# Patient Record
Sex: Male | Born: 1955 | Race: White | Hispanic: No | Marital: Married | Smoking: Current every day smoker
Health system: Southern US, Community
[De-identification: ages and names within clinical notes are randomized; demographics above are authoritative.]

## PROBLEM LIST (undated history)

## (undated) DIAGNOSIS — F17200 Nicotine dependence, unspecified, uncomplicated: Secondary | ICD-10-CM

## (undated) HISTORY — DX: Nicotine dependence, unspecified, uncomplicated: F17.200

---

## 2000-05-20 ENCOUNTER — Emergency Department (HOSPITAL_COMMUNITY): Admission: EM | Admit: 2000-05-20 | Discharge: 2000-05-20 | Payer: Self-pay | Admitting: Emergency Medicine

## 2004-08-17 ENCOUNTER — Ambulatory Visit (HOSPITAL_COMMUNITY): Admission: RE | Admit: 2004-08-17 | Discharge: 2004-08-17 | Payer: Self-pay | Admitting: Internal Medicine

## 2005-08-06 IMAGING — CR DG ORBITS FOR FOREIGN BODY
2 series · 2 of 2 positions shown · non-contrast
Comparison: none

CLINICAL DATA: Welder.
 EYE FOR FOREIGN BODY PRIOR TO MRI ? 08/17/2004:
 No radiopaque foreign bodies are seen.  No findings of concern are identified.

[view not recorded (1 of 2)]
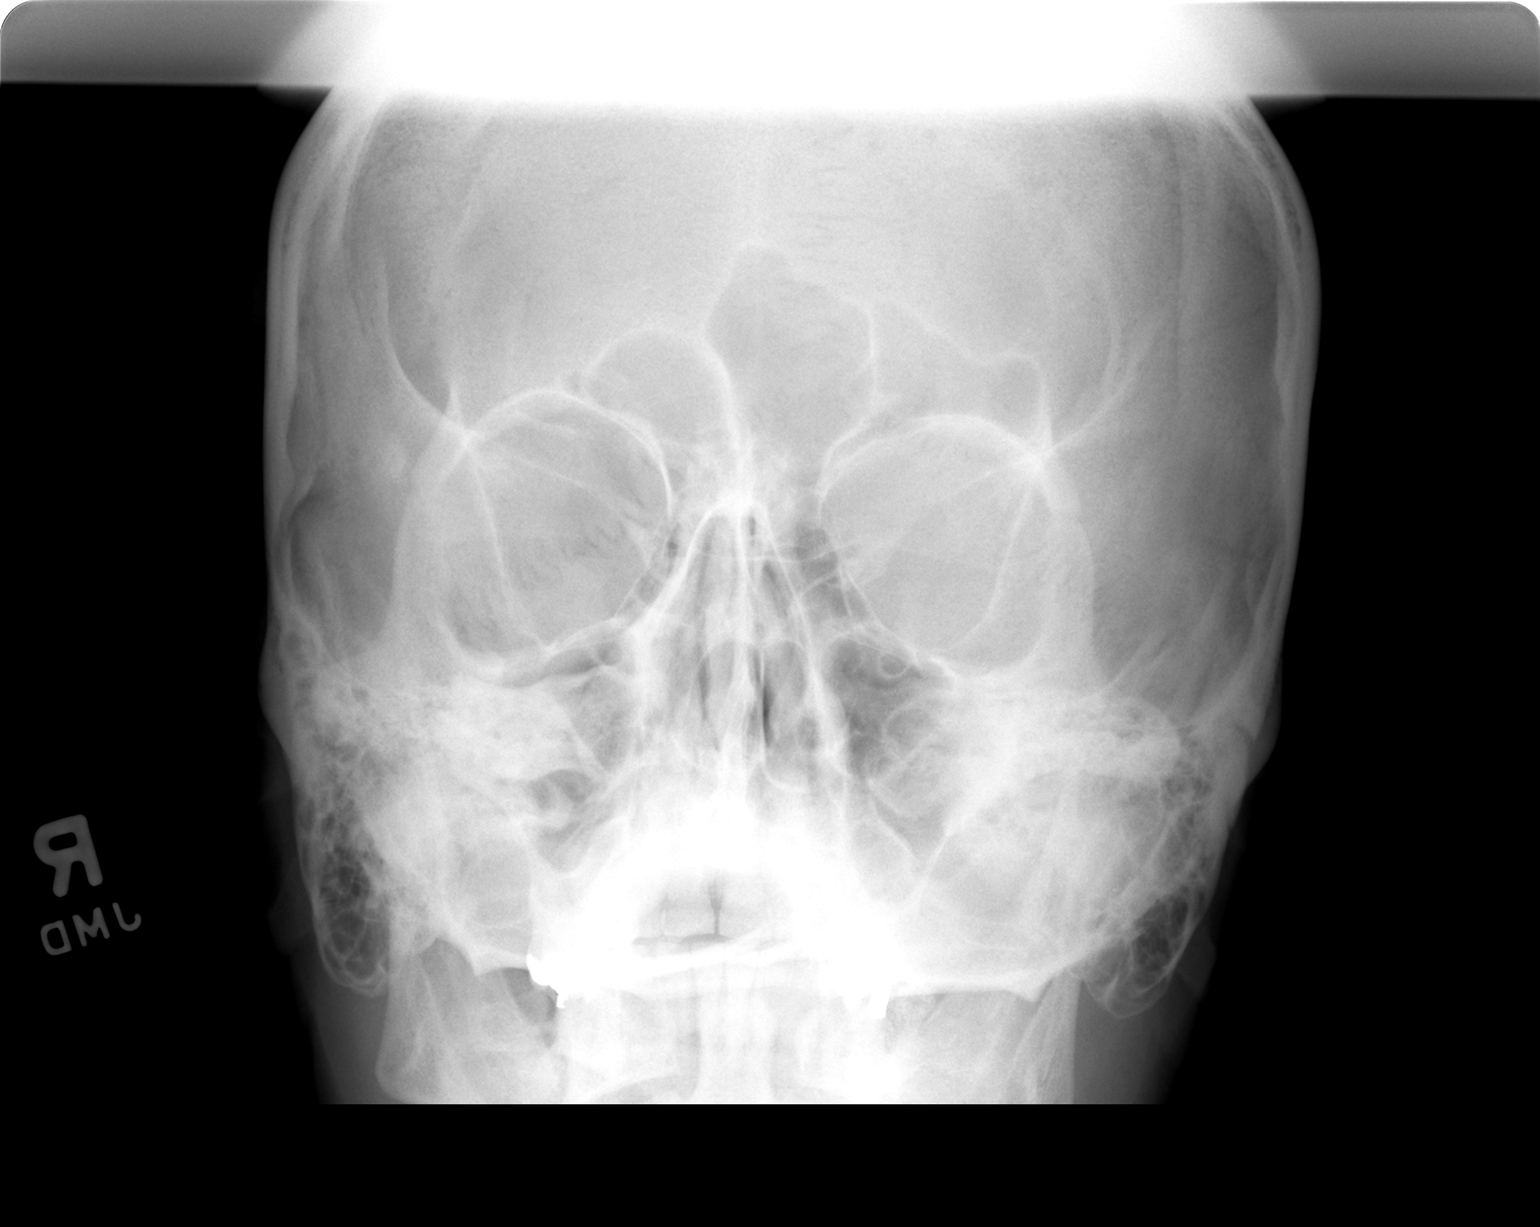

[view not recorded (2 of 2)]
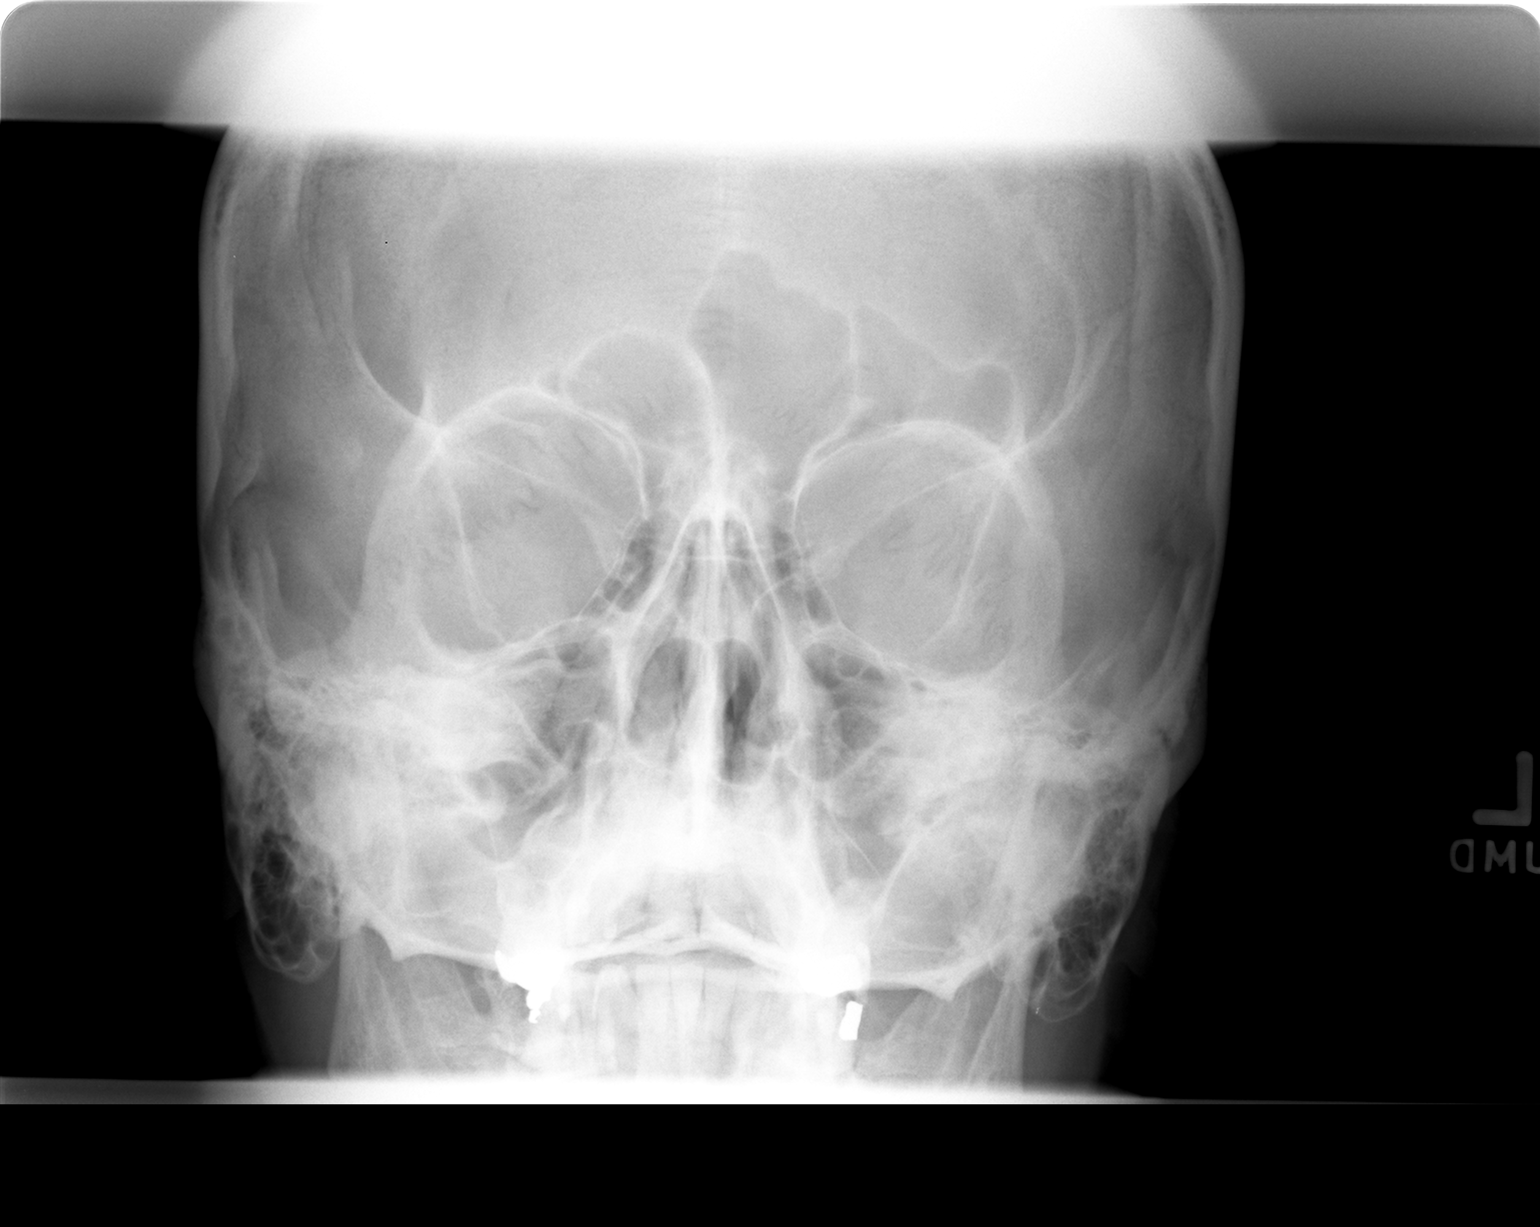

[2 of 2 positions shown; findings below may reference images not displayed]

IMPRESSION: No evidence for radiopaque foreign body.

## 2009-07-11 ENCOUNTER — Ambulatory Visit: Payer: Self-pay | Admitting: Family Medicine

## 2010-07-16 ENCOUNTER — Ambulatory Visit: Payer: Self-pay | Admitting: Family Medicine

## 2011-06-19 ENCOUNTER — Ambulatory Visit (INDEPENDENT_AMBULATORY_CARE_PROVIDER_SITE_OTHER): Payer: BC Managed Care – PPO | Admitting: Medical

## 2011-06-19 ENCOUNTER — Encounter: Payer: Self-pay | Admitting: Medical

## 2011-06-19 VITALS — BP 130/92 | HR 64 | Temp 98.0°F | Resp 16 | Ht 66.0 in | Wt 192.0 lb

## 2011-06-19 DIAGNOSIS — M25579 Pain in unspecified ankle and joints of unspecified foot: Secondary | ICD-10-CM

## 2011-06-19 DIAGNOSIS — M25572 Pain in left ankle and joints of left foot: Secondary | ICD-10-CM

## 2011-06-19 MED ORDER — DICLOFENAC SODIUM 75 MG PO TBEC: 75.0000 mg | DELAYED_RELEASE_TABLET | Freq: Two times a day (BID) | ORAL | Status: AC

## 2011-06-19 NOTE — Progress Notes (Signed)
  Subjective:   HPI Gerald Wood is a 55 y.o. male who presents for c/o left ankle pain x 1-2 weeks.   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  He denies any recent injury, trauma, or twisted ankle.  He wears work boots, and sometimes is on his feet 12 + hours a day.  Changes his boots about once yearly.  He was curious why his ankle is hurting, not sure if its an ankle vs foot or back problem.  Using nothing for symptoms.  Pain is localized to inside of his left ankle, no particular motion worsens the pain.  No other aggravating or relieving factors.  No prior similar episode.  No other c/o.  The following portions of the patient's history were reviewed and updated as appropriate: allergies, current medications, past family history, past medical history, past social history, past surgical history and problem list.  Past Medical History  Diagnosis Date  . Tobacco use disorder    Review of Systems Gen.: No fever, chills, sweats, weight gain Skin: No rash or bruising MSK: No joint swelling, no other joint pain or myalgias Neuro: No numbness or tingling Back: No pain Abdomen: No pain, nausea, vomiting, stool changes     Objective:   Physical Exam  General appearance: alert, no distress, WD/WN, white male Skin: No obvious erythema or ecchymosis MSK: Tender over left medial malleolus, tender just superior to this along the distal tibia, but otherwise non tender, no swelling, normal ankle and foot range of motion, no obvious abnormality. Rest of lower extremity exam is unremarkable Neuro: Normal strength and sensation of lower extremities, DTRs normal   Assessment :    Encounter Diagnosis  Name Primary?  . Left ankle pain Yes    Plan:  Left ankle xray today with no obvious bony abnormality, no fracture, no soft tissue swelling, unremarkable.  Will send xray for over read.   I will  treat for inflammation with Voltaren twice a day as needed for the next 5-7 days, advised ice, elevation, relative rest, and consider changing boots as his wear pattern suggest over supination and these boots appear to be at the end of their useful life.  I suspect the cause is inflammation from boot wear and activity, but if not improving in one to 2 weeks, consider stress fracture or other etiology.

## 2011-07-01 ENCOUNTER — Encounter: Payer: Self-pay | Admitting: Family Medicine

## 2011-07-02 ENCOUNTER — Encounter: Payer: Self-pay | Admitting: Family Medicine

## 2011-07-02 ENCOUNTER — Ambulatory Visit (INDEPENDENT_AMBULATORY_CARE_PROVIDER_SITE_OTHER): Payer: BC Managed Care – PPO | Admitting: Family Medicine

## 2011-07-02 VITALS — BP 130/80 | HR 82 | Ht 69.0 in | Wt 192.0 lb

## 2011-07-02 DIAGNOSIS — Z0000 Encounter for general adult medical examination without abnormal findings: Secondary | ICD-10-CM

## 2011-07-02 DIAGNOSIS — Z23 Encounter for immunization: Secondary | ICD-10-CM

## 2011-07-02 DIAGNOSIS — F17200 Nicotine dependence, unspecified, uncomplicated: Secondary | ICD-10-CM

## 2011-07-02 LAB — CBC WITH DIFFERENTIAL/PLATELET
Basophils Absolute: 0 10*3/uL (ref 0.0–0.1)
Basophils Relative: 0 % (ref 0–1)
Eosinophils Absolute: 0.1 10*3/uL (ref 0.0–0.7)
Eosinophils Relative: 1 % (ref 0–5)
HCT: 49.6 % (ref 39.0–52.0)
Hemoglobin: 16.6 g/dL (ref 13.0–17.0)
Lymphocytes Relative: 17 % (ref 12–46)
Lymphs Abs: 1.5 10*3/uL (ref 0.7–4.0)
MCH: 30 pg (ref 26.0–34.0)
MCV: 89.5 fL (ref 78.0–100.0)
Monocytes Absolute: 0.5 10*3/uL (ref 0.1–1.0)
Monocytes Relative: 6 % (ref 3–12)
Neutro Abs: 7 10*3/uL (ref 1.7–7.7)
Neutrophils Relative %: 77 % (ref 43–77)
Platelets: 259 10*3/uL (ref 150–400)
RBC: 5.54 MIL/uL (ref 4.22–5.81)
WBC: 9.2 10*3/uL (ref 4.0–10.5)

## 2011-07-02 LAB — POCT URINALYSIS DIPSTICK
Bilirubin, UA: NEGATIVE
Glucose, UA: NEGATIVE
Ketones, UA: NEGATIVE
Leukocytes, UA: NEGATIVE
Nitrite, UA: NEGATIVE
Spec Grav, UA: 1.02
Urobilinogen, UA: NEGATIVE
pH, UA: 6

## 2011-07-02 LAB — COMPREHENSIVE METABOLIC PANEL
ALT: 21 U/L (ref 0–53)
AST: 21 U/L (ref 0–37)
Alkaline Phosphatase: 63 U/L (ref 39–117)
BUN: 18 mg/dL (ref 6–23)
CO2: 29 mEq/L (ref 19–32)
Calcium: 9.1 mg/dL (ref 8.4–10.5)
Chloride: 100 mEq/L (ref 96–112)
Creat: 1.06 mg/dL (ref 0.50–1.35)
Glucose, Bld: 84 mg/dL (ref 70–99)
Potassium: 4 mEq/L (ref 3.5–5.3)
Sodium: 139 mEq/L (ref 135–145)
Total Bilirubin: 0.9 mg/dL (ref 0.3–1.2)
Total Protein: 6.8 g/dL (ref 6.0–8.3)

## 2011-07-02 LAB — LIPID PANEL
Cholesterol: 198 mg/dL (ref 0–200)
HDL: 38 mg/dL — ABNORMAL LOW (ref >39)
LDL Cholesterol: 128 mg/dL — ABNORMAL HIGH (ref 0–99)
Total CHOL/HDL Ratio: 5.2 Ratio
Triglycerides: 159 mg/dL — ABNORMAL HIGH (ref <150)
VLDL: 32 mg/dL (ref 0–40)

## 2011-07-02 NOTE — Progress Notes (Signed)
  Subjective:    Patient ID: Gerald Wood, male    DOB: 01-Dec-1955, 55 y.o.   MRN: [id number]  HPI He is here for complete examination. He does have occasional aches and pains but rarely takes anything for them except an occasional Voltaren. He has no other concerns or complaints. His work is going well. Home life is going fairly well although he does have 2 daughters still living at home.   Review of Systems  Constitutional: Negative.   HENT: Negative.   Eyes: Negative.   Respiratory: Negative.   Cardiovascular: Negative.   Gastrointestinal: Negative.   Genitourinary: Negative.   Musculoskeletal: Negative.   Skin: Negative.   Neurological: Negative.   Hematological: Negative.   Psychiatric/Behavioral: Negative.        Objective:   Physical Exam BP 130/80  Pulse 82  Ht 5\' 9"  (1.753 m)  Wt 192 lb (87.091 kg)  BMI 28.35 kg/m2  General Appearance:    Alert, cooperative, no distress, appears stated age  Head:    Normocephalic, without obvious abnormality, atraumatic  Eyes:    PERRL, conjunctiva/corneas clear, EOM's intact, fundi    benign  Ears:    Normal TM's and external ear canals  Nose:   Nares normal, mucosa normal, no drainage or sinus   tenderness  Throat:   Lips, mucosa, and tongue normal; teeth and gums normal  Neck:   Supple, no lymphadenopathy;  thyroid:  no   enlargement/tenderness/nodules; no carotid   bruit or JVD  Back:    Spine nontender, no curvature, ROM normal, no CVA     tenderness  Lungs:     Clear to auscultation bilaterally without wheezes, rales or     ronchi; respirations unlabored  Chest Wall:    No tenderness or deformity   Heart:    Regular rate and rhythm, S1 and S2 normal, no murmur, rub   or gallop  Breast Exam:    No chest wall tenderness, masses or gynecomastia  Abdomen:     Soft, non-tender, nondistended, normoactive bowel sounds,    no masses, no hepatosplenomegaly     Rectal:    Normal sphincter tone, no masses or tenderness; guaiac  negative stool.  Prostate smooth, no nodules, not enlarged.  Extremities:   No clubbing, cyanosis or edema  Pulses:   2+ and symmetric all extremities  Skin:   Skin color, texture, turgor normal, no rashes or lesions  Lymph nodes:   Cervical, supraclavicular, and axillary nodes normal  Neurologic:   CNII-XII intact, normal strength, sensation and gait; reflexes 2+ and symmetric throughout          Psych:   Normal mood, affect, hygiene and grooming.          Assessment & Plan:   1. Physical exam, annual  POCT Urinalysis Dipstick, CBC with Differential, Comprehensive metabolic panel, Lipid panel, POCT Hemoccult (POC) Blood/Stool Test  2. Active smoker     colonoscopy offered but patient refused. Also discussed the fact that his daughters are living at home. Discussed the fact that he could potentially be enabling them to not progress on with their lives. Flu shot given.

## 2011-07-02 NOTE — Patient Instructions (Signed)
Let me know when you're Gerald Wood to quit and I will work with you on that.

## 2013-09-30 ENCOUNTER — Encounter: Payer: Self-pay | Admitting: Family Medicine

## 2013-09-30 ENCOUNTER — Ambulatory Visit (INDEPENDENT_AMBULATORY_CARE_PROVIDER_SITE_OTHER): Payer: Managed Care, Other (non HMO) | Admitting: Family Medicine

## 2013-09-30 VITALS — BP 118/90 | HR 94 | Wt 204.0 lb

## 2013-09-30 DIAGNOSIS — H109 Unspecified conjunctivitis: Secondary | ICD-10-CM

## 2013-09-30 DIAGNOSIS — J209 Acute bronchitis, unspecified: Secondary | ICD-10-CM

## 2013-09-30 MED ORDER — AMOXICILLIN 875 MG PO TABS: 875.0000 mg | ORAL_TABLET | Freq: Two times a day (BID) | ORAL | Status: DC

## 2013-09-30 NOTE — Progress Notes (Signed)
   Subjective:    Patient ID: Gerald Wood, male    DOB: 10/18/1955, 58 y.o.   MRN: [id number]  HPI He complains of a 4 day history of sore throat, fever and now productive cough that started yesterday. He did note some drainage from his eye. No fever, chills, earache, and his pain. He does smoke and at this time is not interested in quitting   Review of Systems     Objective:   Physical Exam alert and in no distress. Tympanic membranes and canals are normal. Throat is clear. Tonsils are normal. Neck is supple without adenopathy or thyromegaly. Cardiac exam shows a regular sinus rhythm without murmurs or gallops. Lungs are clear to auscultation. Both conjunctiva are slightly injected with minimal drainage from the right eye.        Assessment & Plan:  Conjunctivitis  Acute bronchitis  I will treat him with Amoxil. Also encouraged him to set up an appointment for complete exam. Review of record indicates he has not had a colonoscopy. He was apparently set up for this and did not get it.

## 2013-10-20 ENCOUNTER — Encounter: Payer: Managed Care, Other (non HMO) | Admitting: Family Medicine

## 2013-11-09 ENCOUNTER — Encounter: Payer: Managed Care, Other (non HMO) | Admitting: Family Medicine

## 2013-11-09 ENCOUNTER — Telehealth: Payer: Self-pay | Admitting: Family Medicine

## 2013-11-09 DIAGNOSIS — Z0000 Encounter for general adult medical examination without abnormal findings: Secondary | ICD-10-CM

## 2013-11-09 NOTE — Telephone Encounter (Signed)
Pt had cpe today and moved it to next week.  He wants to have fasting labs prior.  Please put in orders for his labs.

## 2013-11-17 ENCOUNTER — Other Ambulatory Visit: Payer: Managed Care, Other (non HMO)

## 2013-11-17 DIAGNOSIS — Z0000 Encounter for general adult medical examination without abnormal findings: Secondary | ICD-10-CM

## 2013-11-17 LAB — CBC WITH DIFFERENTIAL/PLATELET
Basophils Absolute: 0 10*3/uL (ref 0.0–0.1)
Basophils Relative: 0 % (ref 0–1)
Eosinophils Absolute: 0.1 10*3/uL (ref 0.0–0.7)
Eosinophils Relative: 1 % (ref 0–5)
HCT: 46.5 % (ref 39.0–52.0)
Hemoglobin: 16.4 g/dL (ref 13.0–17.0)
Lymphocytes Relative: 21 % (ref 12–46)
Lymphs Abs: 1.6 10*3/uL (ref 0.7–4.0)
MCH: 30 pg (ref 26.0–34.0)
MCHC: 35.3 g/dL (ref 30.0–36.0)
MCV: 85.2 fL (ref 78.0–100.0)
Monocytes Absolute: 0.5 10*3/uL (ref 0.1–1.0)
Monocytes Relative: 6 % (ref 3–12)
Neutro Abs: 5.5 10*3/uL (ref 1.7–7.7)
Neutrophils Relative %: 72 % (ref 43–77)
Platelets: 279 10*3/uL (ref 150–400)
RBC: 5.46 MIL/uL (ref 4.22–5.81)
RDW: 14.3 % (ref 11.5–15.5)
WBC: 7.7 10*3/uL (ref 4.0–10.5)

## 2013-11-17 LAB — LIPID PANEL
Cholesterol: 175 mg/dL (ref 0–200)
HDL: 40 mg/dL (ref >39)
LDL Cholesterol: 119 mg/dL — ABNORMAL HIGH (ref 0–99)
Total CHOL/HDL Ratio: 4.4 Ratio
Triglycerides: 81 mg/dL (ref <150)
VLDL: 16 mg/dL (ref 0–40)

## 2013-11-17 LAB — COMPREHENSIVE METABOLIC PANEL
ALT: 19 U/L (ref 0–53)
AST: 18 U/L (ref 0–37)
Albumin: 4.6 g/dL (ref 3.5–5.2)
Alkaline Phosphatase: 57 U/L (ref 39–117)
BUN: 20 mg/dL (ref 6–23)
CO2: 27 mEq/L (ref 19–32)
Calcium: 9.5 mg/dL (ref 8.4–10.5)
Chloride: 104 mEq/L (ref 96–112)
Creat: 0.98 mg/dL (ref 0.50–1.35)
Glucose, Bld: 90 mg/dL (ref 70–99)
Potassium: 4.1 mEq/L (ref 3.5–5.3)
Sodium: 140 mEq/L (ref 135–145)
Total Bilirubin: 0.6 mg/dL (ref 0.2–1.2)
Total Protein: 6.7 g/dL (ref 6.0–8.3)

## 2013-11-18 ENCOUNTER — Ambulatory Visit (INDEPENDENT_AMBULATORY_CARE_PROVIDER_SITE_OTHER): Payer: Managed Care, Other (non HMO) | Admitting: Family Medicine

## 2013-11-18 ENCOUNTER — Encounter: Payer: Self-pay | Admitting: Family Medicine

## 2013-11-18 VITALS — BP 128/80 | HR 80 | Ht 69.0 in | Wt 198.0 lb

## 2013-11-18 DIAGNOSIS — Z2911 Encounter for prophylactic immunotherapy for respiratory syncytial virus (RSV): Secondary | ICD-10-CM

## 2013-11-18 DIAGNOSIS — Z0000 Encounter for general adult medical examination without abnormal findings: Secondary | ICD-10-CM

## 2013-11-18 DIAGNOSIS — Z1211 Encounter for screening for malignant neoplasm of colon: Secondary | ICD-10-CM

## 2013-11-18 LAB — POCT URINALYSIS DIPSTICK
Bilirubin, UA: NEGATIVE
Blood, UA: 50
Glucose, UA: NEGATIVE
Leukocytes, UA: NEGATIVE
Nitrite, UA: NEGATIVE
Protein, UA: NEGATIVE
Spec Grav, UA: 1.015
Urobilinogen, UA: NEGATIVE
pH, UA: 5

## 2013-11-18 NOTE — Progress Notes (Signed)
   Subjective:    Patient ID: Gerald Wood, male    DOB: 01/09/1956, 58 y.o.   MRN: [id number]  HPI He is here for a complete examination. He has no particular concerns or complaints. He does smoke and is not interested in quitting. Family and social history were reviewed. His work is going well. His home life is going well. His immunizations were updated. He would like to get a shingles vaccine.   Review of Systems  All other systems reviewed and are negative.       Objective:   Physical Exam BP 128/80  Pulse 80  Ht 5\' 9"  (1.753 m)  Wt 198 lb (89.812 kg)  BMI 29.23 kg/m2  General Appearance:    Alert, cooperative, no distress, appears stated age  Head:    Normocephalic, without obvious abnormality, atraumatic  Eyes:    PERRL, conjunctiva/corneas clear, EOM's intact, fundi    benign  Ears:    Normal TM's and external ear canals  Nose:   Nares normal, mucosa normal, no drainage or sinus   tenderness  Throat:   Lips, mucosa, and tongue normal; teeth and gums normal  Neck:   Supple, no lymphadenopathy;  thyroid:  no   enlargement/tenderness/nodules; no carotid   bruit or JVD  Back:    Spine nontender, no curvature, ROM normal, no CVA     tenderness  Lungs:     Clear to auscultation bilaterally without wheezes, rales or     ronchi; respirations unlabored  Chest Wall:    No tenderness or deformity   Heart:    Regular rate and rhythm, S1 and S2 normal, no murmur, rub   or gallop  Breast Exam:    No chest wall tenderness, masses or gynecomastia  Abdomen:     Soft, non-tender, nondistended, normoactive bowel sounds,    no masses, no hepatosplenomegaly  Genitalia:   deferred   Rectal:   deferred   Extremities:   No clubbing, cyanosis or edema  Pulses:   2+ and symmetric all extremities  Skin:   Skin color, texture, turgor normal, no rashes or lesions  Lymph nodes:   Cervical, supraclavicular, and axillary nodes normal  Neurologic:   CNII-XII intact, normal strength, sensation  and gait; reflexes 2+ and symmetric throughout          Psych:   Normal mood, affect, hygiene and grooming.     His blood work was normal.     Assessment & Plan:  Routine general medical examination at a health care facility - Plan: Urinalysis Dipstick, Varicella-zoster vaccine subcutaneous  Special screening for malignant neoplasms, colon - Plan: Ambulatory referral to Gastroenterology  he is not interested in quitting smoking at this time. Did explain that if his insurance didn't cover it he would be an expense to him concerning the suspects and he acknowledges and accepts that.

## 2013-12-27 ENCOUNTER — Encounter: Payer: Self-pay | Admitting: Family Medicine

## 2019-06-30 ENCOUNTER — Other Ambulatory Visit: Payer: Self-pay

## 2019-06-30 DIAGNOSIS — Z20822 Contact with and (suspected) exposure to covid-19: Secondary | ICD-10-CM

## 2019-07-01 LAB — NOVEL CORONAVIRUS, NAA: SARS-CoV-2, NAA: NOT DETECTED

## 2020-02-22 ENCOUNTER — Other Ambulatory Visit: Payer: Self-pay

## 2020-02-22 ENCOUNTER — Ambulatory Visit: Payer: Self-pay

## 2020-02-22 ENCOUNTER — Other Ambulatory Visit: Payer: Self-pay | Admitting: Family Medicine

## 2020-02-22 DIAGNOSIS — S20211A Contusion of right front wall of thorax, initial encounter: Secondary | ICD-10-CM

## 2022-05-22 ENCOUNTER — Encounter: Payer: Self-pay | Admitting: Internal Medicine

## 2022-06-25 ENCOUNTER — Encounter: Payer: Self-pay | Admitting: Internal Medicine

## 2022-07-08 ENCOUNTER — Encounter: Payer: Self-pay | Admitting: Internal Medicine

## 2023-11-11 ENCOUNTER — Encounter: Payer: Self-pay | Admitting: Internal Medicine

## 2024-05-14 ENCOUNTER — Ambulatory Visit
Admission: RE | Admit: 2024-05-14 | Discharge: 2024-05-14 | Disposition: A | Discharge status: Home / Self Care | Payer: Self-pay | Source: Ambulatory Visit | Attending: Physician Assistant | Admitting: Physician Assistant

## 2024-05-14 ENCOUNTER — Other Ambulatory Visit: Payer: Self-pay | Admitting: Physician Assistant

## 2024-05-14 DIAGNOSIS — Z0000 Encounter for general adult medical examination without abnormal findings: Secondary | ICD-10-CM
